# Patient Record
Sex: Male | Born: 2000 | Race: Black or African American | Hispanic: No | Marital: Single | Smoking: Never smoker
Health system: Southern US, Community
[De-identification: ages and names within clinical notes are randomized; demographics above are authoritative.]

## PROBLEM LIST (undated history)

## (undated) DIAGNOSIS — F909 Attention-deficit hyperactivity disorder, unspecified type: Secondary | ICD-10-CM

## (undated) DIAGNOSIS — E669 Obesity, unspecified: Secondary | ICD-10-CM

## (undated) DIAGNOSIS — K219 Gastro-esophageal reflux disease without esophagitis: Secondary | ICD-10-CM

## (undated) DIAGNOSIS — J45909 Unspecified asthma, uncomplicated: Secondary | ICD-10-CM

## (undated) HISTORY — PX: CIRCUMCISION: SUR203

## (undated) HISTORY — PX: OTHER SURGICAL HISTORY: SHX169

## (undated) HISTORY — DX: Obesity, unspecified: E66.9

## (undated) HISTORY — DX: Unspecified asthma, uncomplicated: J45.909

## (undated) HISTORY — DX: Attention-deficit hyperactivity disorder, unspecified type: F90.9

## (undated) HISTORY — DX: Gastro-esophageal reflux disease without esophagitis: K21.9

---
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
## 2023-02-10 ENCOUNTER — Emergency Department (HOSPITAL_BASED_OUTPATIENT_CLINIC_OR_DEPARTMENT_OTHER): Payer: Medicaid Other

## 2023-02-10 ENCOUNTER — Encounter (HOSPITAL_BASED_OUTPATIENT_CLINIC_OR_DEPARTMENT_OTHER): Payer: Self-pay

## 2023-02-10 ENCOUNTER — Emergency Department (HOSPITAL_BASED_OUTPATIENT_CLINIC_OR_DEPARTMENT_OTHER)
Admission: EM | Admit: 2023-02-10 | Discharge: 2023-02-10 | Disposition: A | Discharge status: Home / Self Care | Payer: Medicaid Other | Attending: Emergency Medicine | Admitting: Emergency Medicine

## 2023-02-10 ENCOUNTER — Other Ambulatory Visit: Payer: Self-pay

## 2023-02-10 DIAGNOSIS — R001 Bradycardia, unspecified: Secondary | ICD-10-CM | POA: Diagnosis not present

## 2023-02-10 DIAGNOSIS — R0789 Other chest pain: Secondary | ICD-10-CM | POA: Diagnosis not present

## 2023-02-10 DIAGNOSIS — R0602 Shortness of breath: Secondary | ICD-10-CM | POA: Insufficient documentation

## 2023-02-10 LAB — COMPREHENSIVE METABOLIC PANEL
ALT: 20 U/L (ref 0–44)
AST: 23 U/L (ref 15–41)
Albumin: 4.1 g/dL (ref 3.5–5.0)
Alkaline Phosphatase: 49 U/L (ref 38–126)
Anion gap: 10 (ref 5–15)
BUN: 12 mg/dL (ref 6–20)
CO2: 23 mmol/L (ref 22–32)
Calcium: 9.2 mg/dL (ref 8.9–10.3)
Chloride: 104 mmol/L (ref 98–111)
Creatinine, Ser: 0.86 mg/dL (ref 0.61–1.24)
GFR, Estimated: >60 mL/min (ref >60)
Glucose, Bld: 109 mg/dL — ABNORMAL HIGH (ref 70–99)
Potassium: 3.5 mmol/L (ref 3.5–5.1)
Sodium: 137 mmol/L (ref 135–145)
Total Bilirubin: 1.3 mg/dL — ABNORMAL HIGH (ref 0.3–1.2)
Total Protein: 7.4 g/dL (ref 6.5–8.1)

## 2023-02-10 LAB — CBC WITH DIFFERENTIAL/PLATELET
Abs Immature Granulocytes: 0.02 10*3/uL (ref 0.00–0.07)
Basophils Absolute: 0.1 10*3/uL (ref 0.0–0.1)
Basophils Relative: 1 %
Eosinophils Absolute: 0.1 10*3/uL (ref 0.0–0.5)
Eosinophils Relative: 1 %
HCT: 41.3 % (ref 39.0–52.0)
Hemoglobin: 14.5 g/dL (ref 13.0–17.0)
Immature Granulocytes: 0 %
Lymphocytes Relative: 35 %
Lymphs Abs: 2.3 10*3/uL (ref 0.7–4.0)
MCH: 30.7 pg (ref 26.0–34.0)
MCHC: 35.1 g/dL (ref 30.0–36.0)
MCV: 87.5 fL (ref 80.0–100.0)
Monocytes Absolute: 0.7 10*3/uL (ref 0.1–1.0)
Monocytes Relative: 10 %
Neutro Abs: 3.5 10*3/uL (ref 1.7–7.7)
Neutrophils Relative %: 53 %
Platelets: 278 10*3/uL (ref 150–400)
RBC: 4.72 MIL/uL (ref 4.22–5.81)
RDW: 11.9 % (ref 11.5–15.5)
WBC: 6.5 10*3/uL (ref 4.0–10.5)
nRBC: 0 % (ref 0.0–0.2)

## 2023-02-10 LAB — TROPONIN I (HIGH SENSITIVITY): Troponin I (High Sensitivity): 3 ng/L (ref <18)

## 2023-02-10 MED ORDER — ALPRAZOLAM 0.5 MG PO TABS
0.5000 mg | ORAL_TABLET | Freq: Once | ORAL | Status: AC
  Administered 2023-02-10: 0.5 mg via ORAL
  Filled 2023-02-10: qty 1

## 2023-02-10 MED ORDER — HYDROXYZINE HCL 25 MG PO TABS
25.0000 mg | ORAL_TABLET | Freq: Once | ORAL | Status: AC
  Administered 2023-02-10: 25 mg via ORAL
  Filled 2023-02-10: qty 1

## 2023-02-10 NOTE — ED Notes (Signed)
Pt notably less anxious. Lying on his R side resting now, reclined the bed for him. Male friend at bedside without complaint. Got pt gingerale and pack of nabs at bedside.

## 2023-02-10 NOTE — ED Triage Notes (Signed)
C/o shortness of breath, chest tightness since Monday. States feels anxious, new stress in life. Sent here for eval by UC.

## 2023-02-10 NOTE — ED Notes (Signed)
Cannot discharge, registration in chart.   

## 2023-02-10 NOTE — ED Notes (Signed)
Discharge paperwork reviewed entirely with patient, including follow up care. Pain was under control. No prescriptions were called in, but all questions were addressed.  Pt verbalized understanding as well as all parties involved. No questions or concerns voiced at the time of discharge. No acute distress noted.   Pt ambulated out to PVA without incident or assistance.  

## 2023-02-10 NOTE — ED Notes (Signed)
Pt endorses losing a brother recently, and having another brother in the hospital. Works full time, in school full time, plays football, and traveling consistently. Unable to turn his brain off and rest. 2x years marijuana non-chronic use, but no abuse, has been cutting back on use due to it not helping. Sleep aids don't help. No ETOH abuse.

## 2023-02-10 NOTE — ED Provider Notes (Signed)
[facility] Provider Note   CSN: [id number] Arrival date & time: 02/10/23  4098     History  Chief Complaint  Patient presents with   Shortness of Breath    Charles Clarke is a 22 y.o. male.  22 y.o male with no PMH presents to the ED with a chief complaint of shortness of breath x 2 days. Patient reports feeling congested, "not feeling my best", "my chest feels tight, like my throat is closing in". He has also been feeling short of breath, he has tried using his girlfriend little sister inhaler. He also reports stop smoking marijuana recently. Patient states he feels more worked up about school, work, football lately. This is the first summer patient is spending in [place]. No alleviating factors.Patient went to [facility] and sent to the ED for bradycardia. No hx of CAD, Fhx of CAD with his grandparents passing away from an MI. His brother passed away from a cardiac arrest at the age of 75. No tobacco use.     The history is provided by the patient and medical records.  Shortness of Breath Severity:  Moderate Associated symptoms: chest pain and sore throat   Associated symptoms: no abdominal pain, no fever, no headaches and no vomiting         Home Medications Prior to Admission medications   Medication Sig Start Date End Date Taking? Authorizing Provider  albuterol (PROVENTIL HFA;VENTOLIN HFA) 108 (90 BASE) MCG/ACT inhaler Inhale 2 puffs into the lungs every 6 (six) hours as needed for wheezing.   Yes [provider]  ACCU-CHEK FASTCLIX LANCETS MISC 1 each by Does not apply route 2 (two) times daily. Check sugar 2 x daily Patient not taking: Reported on 10/15/2015 01/01/15   Alfonso Ramus T, FNP  glucose blood (ACCU-CHEK SMARTVIEW) test strip Check sugar 6 x daily Patient not taking: Reported on 02/10/2023 01/01/15   Verneda Skill, FNP  ibuprofen (ADVIL,MOTRIN) 600 MG tablet Take 1 tablet (600 mg total) by mouth every  6 (six) hours as needed. Patient not taking: Reported on 10/15/2015 05/17/15   Sharene Skeans, MD  metFORMIN (GLUCOPHAGE) 500 MG tablet Take 1 tablet (500 mg total) by mouth 2 (two) times daily with a meal. Patient not taking: Reported on 02/10/2023 03/08/13   David Stall, MD  promethazine (PHENERGAN) 12.5 MG tablet Take 1 tablet (12.5 mg total) by mouth every 6 (six) hours as needed (headache). Patient not taking: Reported on 02/10/2023 05/29/15   Margurite Auerbach, MD      Allergies    Other, Penicillins, Diazepam, and Aspirin    Review of Systems   Review of Systems  Constitutional:  Negative for chills and fever.  HENT:  Positive for sore throat.   Respiratory:  Positive for shortness of breath.   Cardiovascular:  Positive for chest pain.  Gastrointestinal:  Negative for abdominal pain, nausea and vomiting.  Genitourinary:  Negative for flank pain.  Musculoskeletal:  Negative for back pain.  Neurological:  Negative for light-headedness and headaches.  All other systems reviewed and are negative.   Physical Exam Updated Vital Signs BP 115/61   Pulse (!) 51   Temp 97.8 F (36.6 C) (Oral)   Resp 17   Ht 5\' 10"  (1.778 m)   Wt 107.5 kg   SpO2 100%   BMI 34.01 kg/m  Physical Exam Vitals and nursing note reviewed.  Constitutional:      Appearance: He is well-developed.  He is not ill-appearing.  HENT:     Head: Normocephalic and atraumatic.  Cardiovascular:     Rate and Rhythm: Bradycardia present.  Pulmonary:     Effort: Pulmonary effort is normal.     Breath sounds: No decreased breath sounds, wheezing or rhonchi.  Chest:     Chest wall: Tenderness present.  Abdominal:     Palpations: Abdomen is soft.  Musculoskeletal:     Cervical back: Normal range of motion and neck supple.     Right lower leg: No edema.     Left lower leg: No edema.  Skin:    General: Skin is warm and dry.  Neurological:     Mental Status: He is alert and oriented to person, place, and time.      ED Results / Procedures / Treatments   Labs (all labs ordered are listed, but only abnormal results are displayed) Labs Reviewed  COMPREHENSIVE METABOLIC PANEL - Abnormal; Notable for the following components:      Result Value   Glucose, Bld 109 (*)    Total Bilirubin 1.3 (*)    All other components within normal limits  CBC WITH DIFFERENTIAL/PLATELET  TROPONIN I (HIGH SENSITIVITY)  TROPONIN I (HIGH SENSITIVITY)    EKG None  Radiology DG Chest 2 View  Result Date: 02/10/2023 CLINICAL DATA:  Shortness of breath. EXAM: CHEST - 2 VIEW COMPARISON:  03/28/2009. FINDINGS: Clear lungs. Normal heart size and mediastinal contours. No pleural effusion or pneumothorax. Visualized bones and upper abdomen are unremarkable. IMPRESSION: No evidence of acute cardiopulmonary disease. Electronically Signed   By: Orvan Falconer M.D.   On: 02/10/2023 09:57    Procedures Procedures    Medications Ordered in ED Medications  hydrOXYzine (ATARAX) tablet 25 mg (25 mg Oral Given 02/10/23 0959)  ALPRAZolam Prudy Feeler) tablet 0.5 mg (0.5 mg Oral Given 02/10/23 1026)    ED Course/ Medical Decision Making/ A&P                             Medical Decision Making Amount and/or Complexity of Data Reviewed Labs: ordered. Radiology: ordered.  Risk Prescription drug management.    This patient presents to the ED for concern of SOB, this involves a number of treatment options, and is a complaint that carries with it a high risk of complications and morbidity.  The differential diagnosis includes asthma exacerbation, ACS, PE versus panic attack.   Co morbidities: Discussed in HPI   Brief History:  See HPI.   EMR reviewed including pt PMHx, past surgical history and past visits to ER.   See HPI for more details   Lab Tests:  I ordered and independently interpreted labs.  The pertinent results include:    I personally reviewed all laboratory work and imaging. Metabolic panel without any  acute abnormality specifically kidney function within normal limits and no significant electrolyte abnormalities. CBC without leukocytosis or significant anemia.   Imaging Studies:  NAD. I personally reviewed all imaging studies and no acute abnormality found. I agree with radiology interpretation.  Cardiac Monitoring:  The patient was maintained on a cardiac monitor.  I personally viewed and interpreted the cardiac monitored which showed an underlying rhythm of: NSR bradycardia EKG non-ischemic   Medicines ordered:  I ordered medication including atarax, xanax  for symptomatic treatment.  Reevaluation of the patient after these medicines showed that the patient improved I have reviewed the patients home medicines and have made  adjustments as needed  Reevaluation:  After the interventions noted above I re-evaluated patient and found that they have :improved   Social Determinants of Health:  The patient's social determinants of health were a factor in the care of this patient    Problem List / ED Course:  Patient here with sudden on sent of SOB x 2 days, feels increase in stress in his life lately with football, school and work. Evaluated at [facility] this morning and sent to the ED due to EKG NSR with bradycardia present. He does report working out 5 days a week playing football and high intensity activity.  He has been trying to use his girlfriend little sister breathing treatment and discontinue marijuana use, has tried melatonin but continues to feels chest tightness.  Heart score is 1, prior to troponin with some family hx of CAD.  Exam is benign, no wheezing, clear chest x-ray.  EKG is normal sinus rhythm with some bradycardia present.  Given initially Atarax to help with his symptoms.  No improvement therefore proceeded with 25 of clinic.  We discussed the labs follow-up with further workup to rule out any cardiac etiology although suspicion is low at this time in this healthy 22  year old.  Lab work today with a CMP with no electrolyte derangement, creatinine levels unremarkable.  LFTs are within normal limits.  Do not suspect GI component at this time.  CBC with no leukocytosis, hemoglobin is intact, no signs of anemia.  Troponin was obtained which was negative.  His heart score being low and symptoms resolving after Xanax with him resting I do not feel that further workup is warranted at this time. Does show bradycardia on his EKG, however this is normal sinus rhythm within the healthy male with no medical history I do not suspect further etiology.  We discussed appropriate follow-up with primary care physician.  Patient is hemodynamically stable for discharge. No tachycardia, no hypoxia, no prior history of blood clots, I do not suspect pulmonary embolism at this time.  Dispostion:  After consideration of the diagnostic results and the patients response to treatment, I feel that the patent would benefit from outpatient follow up with PCP.      Portions of this note were generated with Scientist, clinical (histocompatibility and immunogenetics). Dictation errors may occur despite best attempts at proofreading.   Final Clinical Impression(s) / ED Diagnoses Final diagnoses:  SOB (shortness of breath)    Rx / DC Orders ED Discharge Orders     None         Claude Manges, PA-C 02/10/23 1205    Tanda Rockers A, DO 02/12/23 [phone]

## 2023-02-10 NOTE — Discharge Instructions (Signed)
Your laboratory results were within normal limits.  Your chest xray did not show any signs of infection.   Please schedule an appointment with your primary care physician as needed. If you experience any worsening symptoms please return to the ED.
